# Patient Record
Sex: Female | Born: 1955 | Race: White | Hispanic: No | Marital: Single | Smoking: Current every day smoker
Health system: Southern US, Community
[De-identification: ages and names within clinical notes are randomized; demographics above are authoritative.]

## PROBLEM LIST (undated history)

## (undated) HISTORY — PX: WRIST FRACTURE SURGERY: SHX121

## (undated) HISTORY — PX: HERNIA REPAIR: SHX51

## (undated) HISTORY — PX: TUBAL LIGATION: SHX77

---

## 2014-06-21 ENCOUNTER — Ambulatory Visit: Payer: Self-pay | Admitting: Emergency Medicine

## 2014-11-27 ENCOUNTER — Ambulatory Visit
Admission: EM | Admit: 2014-11-27 | Discharge: 2014-11-27 | Disposition: A | Discharge status: Home / Self Care | Payer: Self-pay | Attending: Family Medicine | Admitting: Family Medicine

## 2014-11-27 ENCOUNTER — Ambulatory Visit: Admit: 2014-11-27 | Payer: Self-pay

## 2014-11-27 ENCOUNTER — Ambulatory Visit: Payer: Self-pay

## 2014-11-27 ENCOUNTER — Encounter: Payer: Self-pay | Admitting: Emergency Medicine

## 2014-11-27 DIAGNOSIS — M47812 Spondylosis without myelopathy or radiculopathy, cervical region: Secondary | ICD-10-CM

## 2014-11-27 DIAGNOSIS — S0101XA Laceration without foreign body of scalp, initial encounter: Secondary | ICD-10-CM

## 2014-11-27 DIAGNOSIS — M4692 Unspecified inflammatory spondylopathy, cervical region: Secondary | ICD-10-CM

## 2014-11-27 DIAGNOSIS — S060X0A Concussion without loss of consciousness, initial encounter: Secondary | ICD-10-CM

## 2014-11-27 DIAGNOSIS — S0093XA Contusion of unspecified part of head, initial encounter: Secondary | ICD-10-CM

## 2014-11-27 DIAGNOSIS — S199XXA Unspecified injury of neck, initial encounter: Secondary | ICD-10-CM

## 2014-11-27 MED ORDER — KETOROLAC TROMETHAMINE 60 MG/2ML IM SOLN
60.0000 mg | Freq: Once | INTRAMUSCULAR | Status: AC
  Administered 2014-11-27: 60 mg via INTRAMUSCULAR

## 2014-11-27 MED ORDER — MELOXICAM 15 MG PO TABS: 15.0000 mg | ORAL_TABLET | Freq: Every day | ORAL | Status: AC

## 2014-11-27 MED ORDER — ORPHENADRINE CITRATE ER 100 MG PO TB12: 100.0000 mg | ORAL_TABLET | Freq: Two times a day (BID) | ORAL | Status: AC

## 2014-11-27 MED ORDER — TETANUS-DIPHTH-ACELL PERTUSSIS 5-2.5-18.5 LF-MCG/0.5 IM SUSP
0.5000 mL | Freq: Once | INTRAMUSCULAR | Status: AC
  Administered 2014-11-27: 0.5 mL via INTRAMUSCULAR

## 2014-11-27 NOTE — ED Notes (Addendum)
Pt reports walking into work this morning about 8;30, sun was in her eyes and she hit her forehead on generator. Denies LOC, n/v. Bleeding controlled.  Pt reports severe headache and neck pain.  Closed laceration and bruise to mid forehead by hairline.   Pt also reports mild dizziness and light bothers here eyes.

## 2014-11-27 NOTE — Discharge Instructions (Signed)
°Concussion °A concussion is a brain injury. It is caused by: °· A hit to the head. °· A quick and sudden movement (jolt) of the head or neck. °A concussion is usually not life threatening. Even so, it can cause serious problems. If you had a concussion before, you may have concussion-like problems after a hit to your head. °HOME CARE °General Instructions °· Follow your doctor's directions carefully. °· Take medicines only as told by your doctor. °· Only take medicines your doctor says are safe. °· Do not drink alcohol until your doctor says it is okay. Alcohol and some drugs can slow down healing. They can also put you at risk for further injury. °· If you are having trouble remembering things, write them down. °· Try to do one thing at a time if you get distracted easily. For example, do not watch TV while making dinner. °· Talk to your family members or close friends when making important decisions. °· Follow up with your doctor as told. °· Watch your symptoms. Tell others to do the same. Serious problems can sometimes happen after a concussion. Older adults are more likely to have these problems. °· Tell your teachers, school nurse, school counselor, coach, athletic trainer, or work manager about your concussion. Tell them about what you can or cannot do. They should watch to see if: °¨ It gets even harder for you to pay attention or concentrate. °¨ It gets even harder for you to remember things or learn new things. °¨ You need more time than normal to finish things. °¨ You become annoyed (irritable) more than before. °¨ You are not able to deal with stress as well. °¨ You have more problems than before. °· Rest. Make sure you: °¨ Get plenty of sleep at night. °¨ Go to sleep early. °¨ Go to bed at the same time every day. Try to wake up at the same time. °¨ Rest during the day. °¨ Take naps when you feel tired. °· Limit activities where you have to think a lot or concentrate. These include: °¨ Doing  homework. °¨ Doing work related to a job. °¨ Watching TV. °¨ Using the computer. °Returning To Your Regular Activities °Return to your normal activities slowly, not all at once. You must give your body and brain enough time to heal.  °· Do not play sports or do other athletic activities until your doctor says it is okay. °· Ask your doctor when you can drive, ride a bicycle, or work other vehicles or machines. Never do these things if you feel dizzy. °· Ask your doctor about when you can return to work or school. °Preventing Another Concussion °It is very important to avoid another brain injury, especially before you have healed. In rare cases, another injury can lead to permanent brain damage, brain swelling, or death. The risk of this is greatest during the first 7-10 days after your injury. Avoid injuries by:  °· Wearing a seat belt when riding in a car. °· Not drinking too much alcohol. °· Avoiding activities that could lead to a second concussion (such as contact sports). °· Wearing a helmet when doing activities like: °¨ Biking. °¨ Skiing. °¨ Skateboarding. °¨ Skating. °· Making your home safer by: °¨ Removing things from the floor or stairways that could make you trip. °¨ Using grab bars in bathrooms and handrails by stairs. °¨ Placing non-slip mats on floors and in bathtubs. °¨ Improve lighting in dark areas. °GET HELP IF: °· It   gets even harder for you to pay attention or concentrate.  It gets even harder for you to remember things or learn new things.  You need more time than normal to finish things.  You become annoyed (irritable) more than before.  You are not able to deal with stress as well.  You have more problems than before.  You have problems keeping your balance.  You are not able to react quickly when you should. Get help if you have any of these problems for more than 2 weeks:   Lasting (chronic) headaches.  Dizziness or trouble balancing.  Feeling sick to your stomach  (nausea).  Seeing (vision) problems.  Being affected by noises or light more than normal.  Feeling sad, low, down in the dumps, blue, gloomy, or empty (depressed).  Mood changes (mood swings).  Feeling of fear or nervousness about what may happen (anxiety).  Feeling annoyed.  Memory problems.  Problems concentrating or paying attention.  Sleep problems.  Feeling tired all the time. GET HELP RIGHT AWAY IF:   You have bad headaches or your headaches get worse.  You have weakness (even if it is in one hand, leg, or part of the face).  You have loss of feeling (numbness).  You feel off balance.  You keep throwing up (vomiting).  You feel tired.  One black center of your eye (pupil) is larger than the other.  You twitch or shake violently (convulse).  Your speech is not clear (slurred).  You are more confused, easily angered (agitated), or annoyed than before.  You have more trouble resting than before.  You are unable to recognize people or places.  You have neck pain.  It is difficult to wake you up.  You have unusual behavior changes.  You pass out (lose consciousness). MAKE SURE YOU:   Understand these instructions.  Will watch your condition.  Will get help right away if you are not doing well or get worse. Document Released: 03/05/2009 Document Revised: 08/01/2013 Document Reviewed: 10/07/2012 [facility] Patient Information 2015 [place], [place]. This information is not intended to replace advice given to you by your health care provider. Make sure you discuss any questions you have with your health care provider.  Contusion A contusion is a deep bruise. Contusions happen when an injury causes bleeding under the skin. Signs of bruising include pain, puffiness (swelling), and discolored skin. The contusion may turn blue, purple, or yellow. HOME CARE   Put ice on the injured area.  Put ice in a plastic bag.  Place a towel between your skin and the  bag.  Leave the ice on for 15-20 minutes, 03-04 times a day.  Only take medicine as told by your doctor.  Rest the injured area.  If possible, raise (elevate) the injured area to lessen puffiness. GET HELP RIGHT AWAY IF:   You have more bruising or puffiness.  You have pain that is getting worse.  Your puffiness or pain is not helped by medicine. MAKE SURE YOU:   Understand these instructions.  Will watch your condition.  Will get help right away if you are not doing well or get worse. Document Released: 09/03/2007 Document Revised: 06/09/2011 Document Reviewed: 01/20/2011 [facility] Patient Information 2015 [place], [place]. This information is not intended to replace advice given to you by your health care provider. Make sure you discuss any questions you have with your health care provider.  Head Injury You have a head injury. Headaches and throwing up (vomiting) are  common after a head injury. It should be easy to wake up from sleeping. Sometimes you must stay in the hospital. Most problems happen within the first 24 hours. Side effects may occur up to 7-10 days after the injury.  WHAT ARE THE TYPES OF HEAD INJURIES? Head injuries can be as minor as a bump. Some head injuries can be more severe. More severe head injuries include:  A jarring injury to the brain (concussion).  A bruise of the brain (contusion). This mean there is bleeding in the brain that can cause swelling.  A cracked skull (skull fracture).  Bleeding in the brain that collects, clots, and forms a bump (hematoma). WHEN SHOULD I GET HELP RIGHT AWAY?   You are confused or sleepy.  You cannot be woken up.  You feel sick to your stomach (nauseous) or keep throwing up (vomiting).  Your dizziness or unsteadiness is getting worse.  You have very bad, lasting headaches that are not helped by medicine. Take medicines only as told by your doctor.  You cannot use your arms or legs like normal.  You cannot  walk.  You notice changes in the black spots in the center of the colored part of your eye (pupil).  You have clear or bloody fluid coming from your nose or ears.  You have trouble seeing. During the next 24 hours after the injury, you must stay with someone who can watch you. This person should get help right away (call 911 in the [place] if you start to shake and are not able to control it (have seizures), you pass out, or you are unable to wake up. HOW CAN I PREVENT A HEAD INJURY IN THE FUTURE?  Wear seat belts.  Wear a helmet while bike riding and playing sports like football.  Stay away from dangerous activities around the house. WHEN CAN I RETURN TO NORMAL ACTIVITIES AND ATHLETICS? See your doctor before doing these activities. You should not do normal activities or play contact sports until 1 week after the following symptoms have stopped:  Headache that does not go away.  Dizziness.  Poor attention.  Confusion.  Memory problems.  Sickness to your stomach or throwing up.  Tiredness.  Fussiness.  Bothered by bright lights or loud noises.  Anxiousness or depression.  Restless sleep. MAKE SURE YOU:   Understand these instructions.  Will watch your condition.  Will get help right away if you are not doing well or get worse. Document Released: 02/28/2008 Document Revised: 08/01/2013 Document Reviewed: 11/22/2012 [facility] Patient Information 2015 [place], [place]. This information is not intended to replace advice given to you by your health care provider. Make sure you discuss any questions you have with your health care provider.

## 2014-11-27 NOTE — ED Provider Notes (Signed)
CSN: [id number]     Arrival date & time 11/27/14  4098 History   First MD Initiated Contact with Patient 11/27/14 1106     Chief Complaint  Patient presents with  . Head Injury   (Consider location/radiation/quality/duration/timing/severity/associated sxs/prior Treatment) Patient is a 59 y.o. female presenting with head injury. The history is provided by the patient. No language interpreter was used.  Head Injury Location:  Frontal Time since incident:  3 hours Mechanism of injury: direct blow   Pain details:    Quality:  Sharp   Radiates to: The pain radiates to her neck.   Severity:  Moderate   Timing:  Constant   Progression:  Unchanged Chronicity:  New Relieved by:  Nothing Associated symptoms: blurred vision, headache and neck pain   Associated symptoms: no nausea, no seizures and no vomiting   Risk factors: no alcohol use, no aspirin use and no obesity    Patient reports walking into generator while going to work. She reports some was in her eyes blinded her and she did not see the generator after she walked into it.  Because she was not clocked in at the time she states that her job is disallowing workers comp port. She received a laceration on her forehead which bled profusely pushes subsequently stopped. She denies LOC but does report being stunned and taken back. She has a headache now and  photophobia at this time as well. She states she did not fall but almost almost fell after hitting her head. From the description her head was forced back and now she also has neck pain as well. History reviewed. No pertinent past medical history. Past Surgical History  Procedure Laterality Date  . Hernia repair    . Tubal ligation    . Wrist fracture surgery Right     severed tendon repair    History reviewed. No pertinent family history. Social History  Substance Use Topics  . Smoking status: Current Every Day Smoker -- 0.50 packs/day    Types: Cigarettes  . Smokeless tobacco:  None  . Alcohol Use: No   OB History    No data available     Review of Systems  Constitutional: Positive for activity change.  Eyes: Positive for blurred vision.  Gastrointestinal: Negative for nausea and vomiting.  Musculoskeletal: Positive for neck pain.  Neurological: Positive for headaches. Negative for seizures.  All other systems reviewed and are negative.   Allergies  Review of patient's allergies indicates no known allergies.  Home Medications   Prior to Admission medications   Medication Sig Start Date End Date Taking? Authorizing Provider  meloxicam (MOBIC) 15 MG tablet Take 1 tablet (15 mg total) by mouth daily. 11/27/14   Hassan Rowan, MD  orphenadrine (NORFLEX) 100 MG tablet Take 1 tablet (100 mg total) by mouth 2 (two) times daily. 11/27/14   Hassan Rowan, MD   Patient denies any significant medical history nurse's notes reviewed. Patient still smokes and was warned that she needs to stop smoking.  Meds Ordered and Administered this Visit   Medications  ketorolac (TORADOL) injection 60 mg (60 mg Intramuscular Given 11/27/14 1237)  Tdap (BOOSTRIX) injection 0.5 mL (0.5 mLs Intramuscular Given 11/27/14 1151)    BP 136/97 mmHg  Pulse 78  Temp(Src) 98.1 F (36.7 C) (Oral)  Resp 16  Ht  (1.626 m)  Wt 116 lb (52.617 kg)  BMI 19.90 kg/m2  SpO2 98% No data found.   Physical Exam  Constitutional: She is  oriented to person, place, and time. She appears well-developed.  HENT:  Head: Normocephalic. Head is with laceration.    Right Ear: Hearing and tympanic membrane normal.  Left Ear: Hearing and tympanic membrane normal.  Small laceration in the middle of the forehead will require suturing this time discloses no bleeding present.  Eyes: Pupils are equal, round, and reactive to light. Right eye exhibits no discharge.  Neck: Normal range of motion. Neck supple. Muscular tenderness present.    Patient has tenderness along the C-spine noted the occipital  area and some discomfort down to C5.  Musculoskeletal: Normal range of motion. She exhibits no edema or tenderness.  Neurological: She is alert and oriented to person, place, and time. She has normal strength and normal reflexes. No cranial nerve deficit or sensory deficit.  Skin: Skin is warm and dry. No erythema.  Psychiatric: She has a normal mood and affect.  Nursing note and vitals reviewed.   ED Course  Procedures (including critical care time)  Labs Review Labs Reviewed - No data to display  Imaging Review No results found.   Visual Acuity Review  Right Eye Distance:   Left Eye Distance:   Bilateral Distance:    Right Eye Near:   Left Eye Near:    Bilateral Near:         MDM   1. Head contusion, initial encounter   2. Concussion, without loss of consciousness, initial encounter   3. Scalp laceration, initial encounter   4. Neck injuries, initial encounter   5. Cervical spine arthritis     Patient initially was to have a CT scan of the head. Question initially whether or not a CT was really wanted. Because the headache and amount of pain she was having we will went ahead and ordered it. However once she found out she would be responsible for the bill she has declined getting the CT scan. She was given an injection of Toradol and a tetanus update with a TdAP injection. Patient will be given a work note she feels that she would not agree to go back to work on Wednesday by stress to her that after Wednesday if she feels like she'll need more time she'll need to return for reevaluation. Patient was not close since it does appear to be bleeding significantly.   Hassan Rowan, MD 11/30/14 [phone]

## 2014-11-27 NOTE — ED Notes (Signed)
Pt refused CT scan as she is self pay and doesn't want to pay for it. Pt agreed to the pain med shot and requested work note off for few days. MD notified.

## 2015-05-09 ENCOUNTER — Ambulatory Visit (INDEPENDENT_AMBULATORY_CARE_PROVIDER_SITE_OTHER): Payer: Self-pay

## 2015-05-09 ENCOUNTER — Ambulatory Visit
Admission: EM | Admit: 2015-05-09 | Discharge: 2015-05-09 | Disposition: A | Discharge status: Home / Self Care | Payer: Self-pay | Attending: Family Medicine | Admitting: Family Medicine

## 2015-05-09 DIAGNOSIS — R0781 Pleurodynia: Secondary | ICD-10-CM

## 2015-05-09 DIAGNOSIS — R319 Hematuria, unspecified: Secondary | ICD-10-CM

## 2015-05-09 DIAGNOSIS — K5900 Constipation, unspecified: Secondary | ICD-10-CM

## 2015-05-09 DIAGNOSIS — M62830 Muscle spasm of back: Secondary | ICD-10-CM

## 2015-05-09 DIAGNOSIS — N390 Urinary tract infection, site not specified: Secondary | ICD-10-CM

## 2015-05-09 LAB — COMPREHENSIVE METABOLIC PANEL
ALT: 13 U/L — AB (ref 14–54)
AST: 13 U/L — AB (ref 15–41)
Albumin: 4.1 g/dL (ref 3.5–5.0)
Alkaline Phosphatase: 67 U/L (ref 38–126)
Anion gap: 6 (ref 5–15)
BUN: 14 mg/dL (ref 6–20)
CHLORIDE: 105 mmol/L (ref 101–111)
CO2: 28 mmol/L (ref 22–32)
CREATININE: 0.91 mg/dL (ref 0.44–1.00)
Calcium: 9.1 mg/dL (ref 8.9–10.3)
Glucose, Bld: 95 mg/dL (ref 65–99)
POTASSIUM: 3.8 mmol/L (ref 3.5–5.1)
SODIUM: 139 mmol/L (ref 135–145)
Total Bilirubin: 0.2 mg/dL — ABNORMAL LOW (ref 0.3–1.2)
Total Protein: 7.1 g/dL (ref 6.5–8.1)

## 2015-05-09 LAB — CBC WITH DIFFERENTIAL/PLATELET
BASOS ABS: 0.1 10*3/uL (ref 0–0.1)
BASOS PCT: 1 %
EOS ABS: 0.1 10*3/uL (ref 0–0.7)
Eosinophils Relative: 1 %
HEMATOCRIT: 44.4 % (ref 35.0–47.0)
HEMOGLOBIN: 15.1 g/dL (ref 12.0–16.0)
Lymphocytes Relative: 30 %
Lymphs Abs: 3.1 10*3/uL (ref 1.0–3.6)
MCH: 30 pg (ref 26.0–34.0)
MCHC: 34.1 g/dL (ref 32.0–36.0)
MCV: 88 fL (ref 80.0–100.0)
Monocytes Absolute: 0.8 10*3/uL (ref 0.2–0.9)
Monocytes Relative: 7 %
NEUTROS ABS: 6.4 10*3/uL (ref 1.4–6.5)
NEUTROS PCT: 61 %
Platelets: 219 10*3/uL (ref 150–440)
RBC: 5.05 MIL/uL (ref 3.80–5.20)
RDW: 13.9 % (ref 11.5–14.5)
WBC: 10.6 10*3/uL (ref 3.6–11.0)

## 2015-05-09 LAB — URINALYSIS COMPLETE WITH MICROSCOPIC (ARMC ONLY)
Bilirubin Urine: NEGATIVE
Glucose, UA: NEGATIVE mg/dL
Ketones, ur: NEGATIVE mg/dL
LEUKOCYTES UA: NEGATIVE
Nitrite: NEGATIVE
PH: 5.5 (ref 5.0–8.0)
PROTEIN: NEGATIVE mg/dL
Specific Gravity, Urine: 1.025 (ref 1.005–1.030)

## 2015-05-09 LAB — AMYLASE: AMYLASE: 51 U/L (ref 28–100)

## 2015-05-09 LAB — LIPASE, BLOOD: LIPASE: 21 U/L (ref 11–51)

## 2015-05-09 MED ORDER — CIPROFLOXACIN HCL 500 MG PO TABS: 500.0000 mg | ORAL_TABLET | Freq: Two times a day (BID) | ORAL | Status: AC

## 2015-05-09 MED ORDER — MELOXICAM 15 MG PO TABS: 15.0000 mg | ORAL_TABLET | Freq: Every day | ORAL | Status: AC

## 2015-05-09 MED ORDER — ORPHENADRINE CITRATE ER 100 MG PO TB12: 100.0000 mg | ORAL_TABLET | Freq: Two times a day (BID) | ORAL | Status: AC

## 2015-05-09 MED ORDER — KETOROLAC TROMETHAMINE 60 MG/2ML IM SOLN
60.0000 mg | Freq: Once | INTRAMUSCULAR | Status: AC
  Administered 2015-05-09: 60 mg via INTRAMUSCULAR
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
## 2015-05-09 NOTE — Discharge Instructions (Signed)
Chest Wall Pain Chest wall pain is pain in or around the bones and muscles of your chest. Sometimes, an injury causes this pain. Sometimes, the cause may not be known. This pain may take several weeks or longer to get better. HOME CARE Pay attention to any changes in your symptoms. Take these actions to help with your pain:  Rest as told by your doctor.  Avoid activities that cause pain. Try not to use your chest, belly (abdominal), or side muscles to lift heavy things.  If directed, apply ice to the painful area:  Put ice in a plastic bag.  Place a towel between your skin and the bag.  Leave the ice on for 20 minutes, 2-3 times per day.  Take over-the-counter and prescription medicines only as told by your doctor.  Do not use tobacco products, including cigarettes, chewing tobacco, and e-cigarettes. If you need help quitting, ask your doctor.  Keep all follow-up visits as told by your doctor. This is important. GET HELP IF:  You have a fever.  Your chest pain gets worse.  You have new symptoms. GET HELP RIGHT AWAY IF:  You feel sick to your stomach (nauseous) or you throw up (vomit).  You feel sweaty or light-headed.  You have a cough with phlegm (sputum) or you cough up blood.  You are short of breath.   This information is not intended to replace advice given to you by your health care provider. Make sure you discuss any questions you have with your health care provider.   Document Released: 09/03/2007 Document Revised: 12/06/2014 Document Reviewed: 06/12/2014 Elsevier Interactive Patient Education 2016 [employer].  Constipation, Adult Constipation is when a person:  Poops (has a bowel movement) less than 3 times a week.  Has a hard time pooping.  Has poop that is dry, hard, or bigger than normal. HOME CARE   Eat foods with a lot of fiber in them. This includes fruits, vegetables, beans, and whole grains such as brown rice.  Avoid fatty foods and foods  with a lot of sugar. This includes french fries, hamburgers, cookies, candy, and soda.  If you are not getting enough fiber from food, take products with added fiber in them (supplements).  Drink enough fluid to keep your pee (urine) clear or pale yellow.  Exercise on a regular basis, or as told by your doctor.  Go to the restroom when you feel like you need to poop. Do not hold it.  Only take medicine as told by your doctor. Do not take medicines that help you poop (laxatives) without talking to your doctor first. GET HELP RIGHT AWAY IF:   You have bright red blood in your poop (stool).  Your constipation lasts more than 4 days or gets worse.  You have belly (abdominal) or butt (rectal) pain.  You have thin poop (as thin as a pencil).  You lose weight, and it cannot be explained. MAKE SURE YOU:   Understand these instructions.  Will watch your condition.  Will get help right away if you are not doing well or get worse.   This information is not intended to replace advice given to you by your health care provider. Make sure you discuss any questions you have with your health care provider.   Document Released: 09/03/2007 Document Revised: 04/07/2014 Document Reviewed: 12/27/2012 Elsevier Interactive Patient Education 2016 [employer]  Costochondritis Costochondritis is a condition in which the tissue (cartilage) that connects your ribs with your breastbone (sternum)  becomes irritated. It causes pain in the chest and rib area. It usually goes away on its own over time. HOME CARE  Avoid activities that wear you out.  Do not strain your ribs. Avoid activities that use your:  Chest.  Belly.  Side muscles.  Put ice on the area for the first 2 days after the pain starts.  Put ice in a plastic bag.  Place a towel between your skin and the bag.  Leave the ice on for 20 minutes, 2-3 times a day.  Only take medicine as told by your doctor. GET HELP IF:  You have  redness or puffiness (swelling) in the rib area.  Your pain does not go away with rest or medicine. GET HELP RIGHT AWAY IF:   Your pain gets worse.  You are very uncomfortable.  You have trouble breathing.  You cough up blood.  You start sweating or throwing up (vomiting).  You have a fever or lasting symptoms for more than 2-3 days.  You have a fever and your symptoms suddenly get worse. MAKE SURE YOU:   Understand these instructions.  Will watch your condition.  Will get help right away if you are not doing well or get worse.   This information is not intended to replace advice given to you by your health care provider. Make sure you discuss any questions you have with your health care provider.   Document Released: 09/03/2007 Document Revised: 11/17/2012 Document Reviewed: 10/19/2012 Elsevier Interactive Patient Education 2016 [employer] Inc[employer]  Hematuria, Adult Hematuria is blood in your urine. It can be caused by a bladder infection, kidney infection, prostate infection, kidney stone, or cancer of your urinary tract. Infections can usually be treated with medicine, and a kidney stone usually will pass through your urine. If neither of these is the cause of your hematuria, further workup to find out the reason may be needed. It is very important that you tell your health care provider about any blood you see in your urine, even if the blood stops without treatment or happens without causing pain. Blood in your urine that happens and then stops and then happens again can be a symptom of a very serious condition. Also, pain is not a symptom in the initial stages of many urinary cancers. HOME CARE INSTRUCTIONS   Drink lots of fluid, 3-4 quarts a day. If you have been diagnosed with an infection, cranberry juice is especially recommended, in addition to large amounts of water.  Avoid caffeine, tea, and carbonated beverages because they tend to irritate the bladder.  Avoid alcohol  because it may irritate the prostate.  Take all medicines as directed by your health care provider.  If you were prescribed an antibiotic medicine, finish it all even if you start to feel better.  If you have been diagnosed with a kidney stone, follow your health care provider's instructions regarding straining your urine to catch the stone.  Empty your bladder often. Avoid holding urine for long periods of time.  After a bowel movement, women should cleanse front to back. Use each tissue only once.  Empty your bladder before and after sexual intercourse if you are a female. SEEK MEDICAL CARE IF:  You develop back pain.  You have a fever.  You have a feeling of sickness in your stomach (nausea) or vomiting.  Your symptoms are not better in 3 days. Return sooner if you are getting worse. SEEK IMMEDIATE MEDICAL CARE IF:   You develop severe  vomiting and are unable to keep the medicine down.  You develop severe back or abdominal pain despite taking your medicines.  You begin passing a large amount of blood or clots in your urine.  You feel extremely weak or faint, or you pass out. MAKE SURE YOU:   Understand these instructions.  Will watch your condition.  Will get help right away if you are not doing well or get worse.   This information is not intended to replace advice given to you by your health care provider. Make sure you discuss any questions you have with your health care provider.   Document Released: 03/17/2005 Document Revised: 04/07/2014 Document Reviewed: 11/15/2012 Elsevier Interactive Patient Education 2016 Else[employer] Inc.   Muscle Cramps and Spasms Muscle cramps and spasms are when muscles tighten by themselves. They usually get better within minutes. Muscle cramps are painful. They are usually stronger and last longer than muscle spasms. Muscle spasms may or may not be painful. They can last a few seconds or much longer. HOME CARE  Drink enough fluid to  keep your pee (urine) clear or pale yellow.  Massage, stretch, and relax the muscle.  Use a warm towel, heating pad, or warm shower water on tight muscles.  Place ice on the muscle if it is tender or in pain.  Put ice in a plastic bag.  Place a towel between your skin and the bag.  Leave the ice on for 15-20 minutes, 03-04 times a day.  Only take medicine as told by your doctor. GET HELP RIGHT AWAY IF:  Your cramps or spasms get worse, happen more often, or do not get better with time. MAKE SURE YOU:  Understand these instructions.  Will watch your condition.  Will get help right away if you are not doing well or get worse.   This information is not intended to replace advice given to you by your health care provider. Make sure you discuss any questions you have with your health care provider.   Document Released: 02/28/2008 Document Revised: 07/12/2012 Document Reviewed: 03/03/2012 Elsevier Interactive Patient Education [employer]

## 2015-05-11 LAB — URINE CULTURE: SPECIAL REQUESTS: NORMAL

## 2017-03-22 IMAGING — CR DG LUMBAR SPINE COMPLETE 4+V
5 series · 5 of 5 positions shown · non-contrast
Comparison: None.

CLINICAL DATA: 59-year-old female with back pain

EXAM:
LUMBAR SPINE - COMPLETE 4+ VIEW

[l-spine ap]
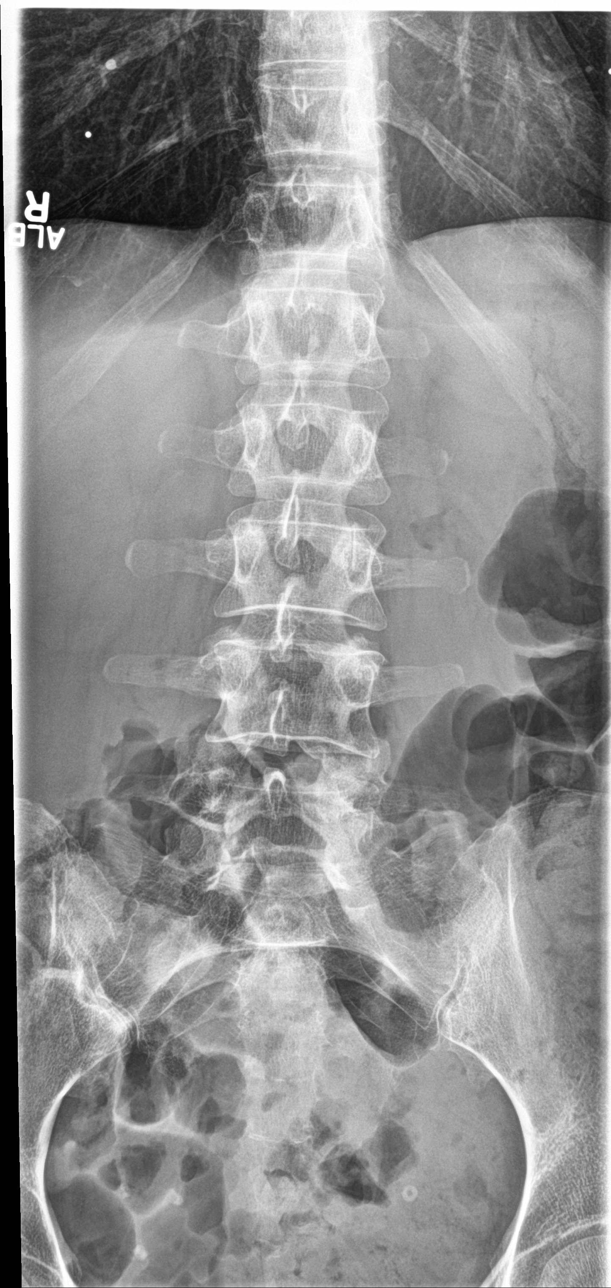

[l-spine obl (1 of 2)]
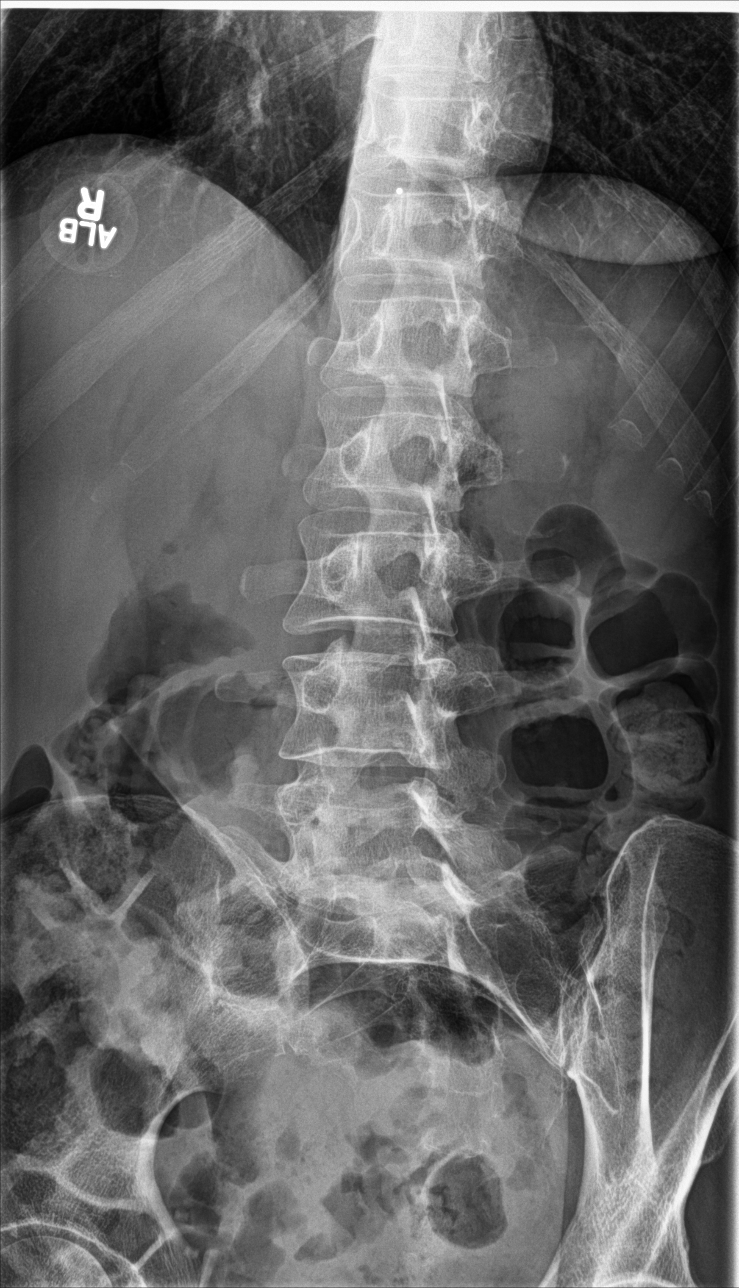

[l-spine obl (2 of 2)]
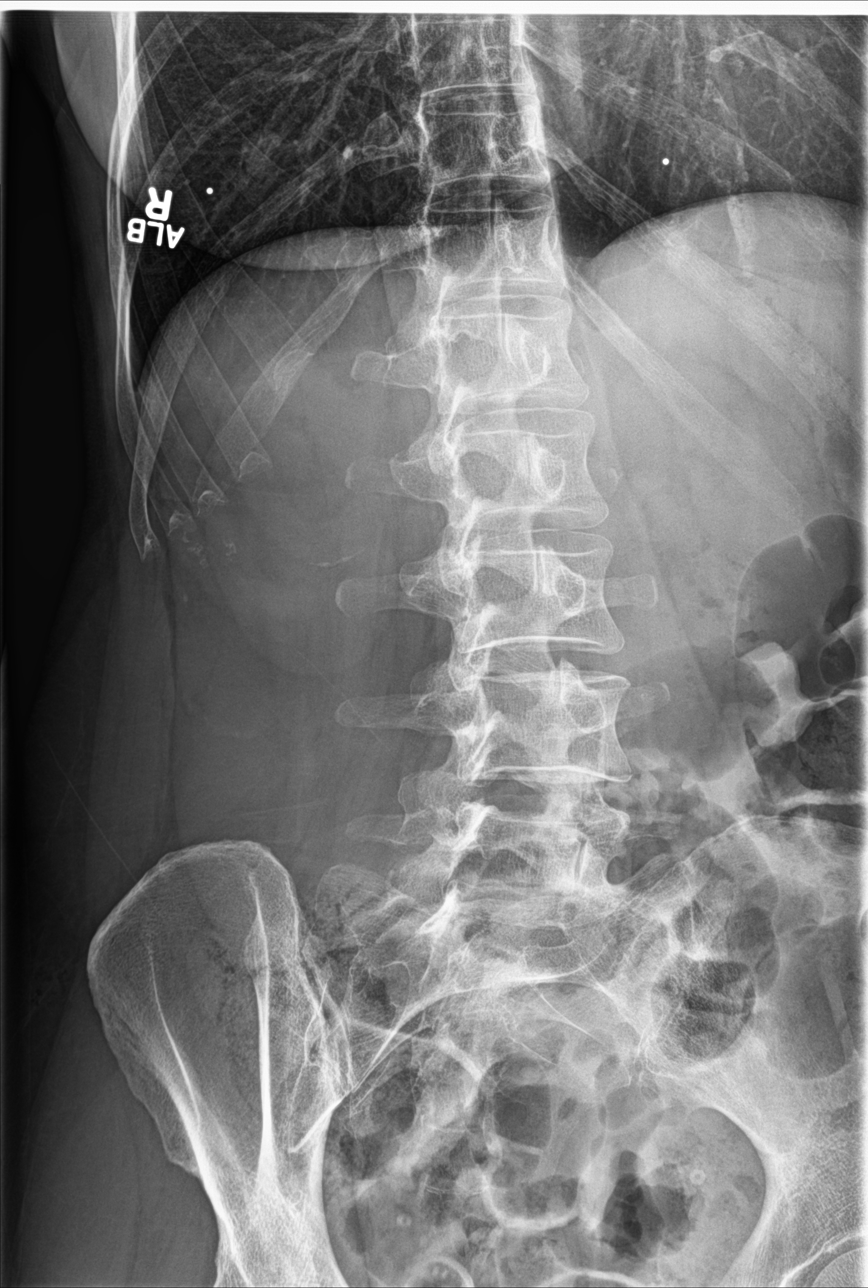

[l-spine lat]
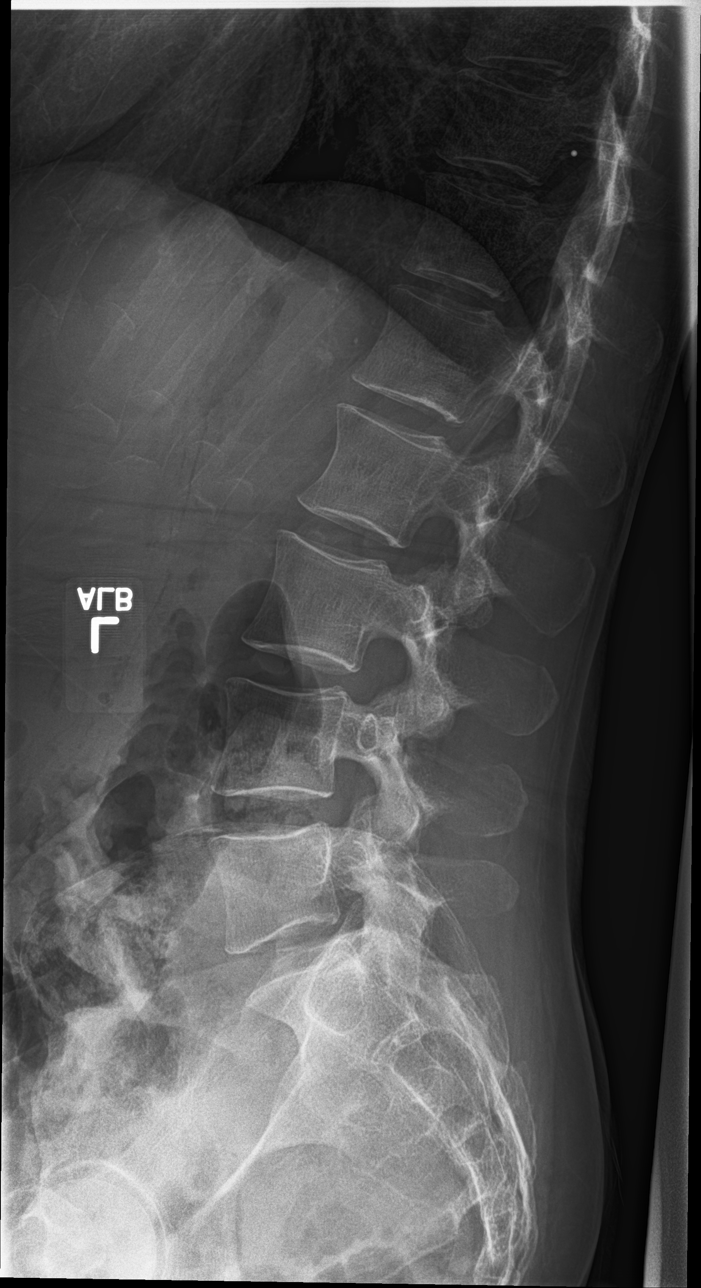

[l-spine spot]
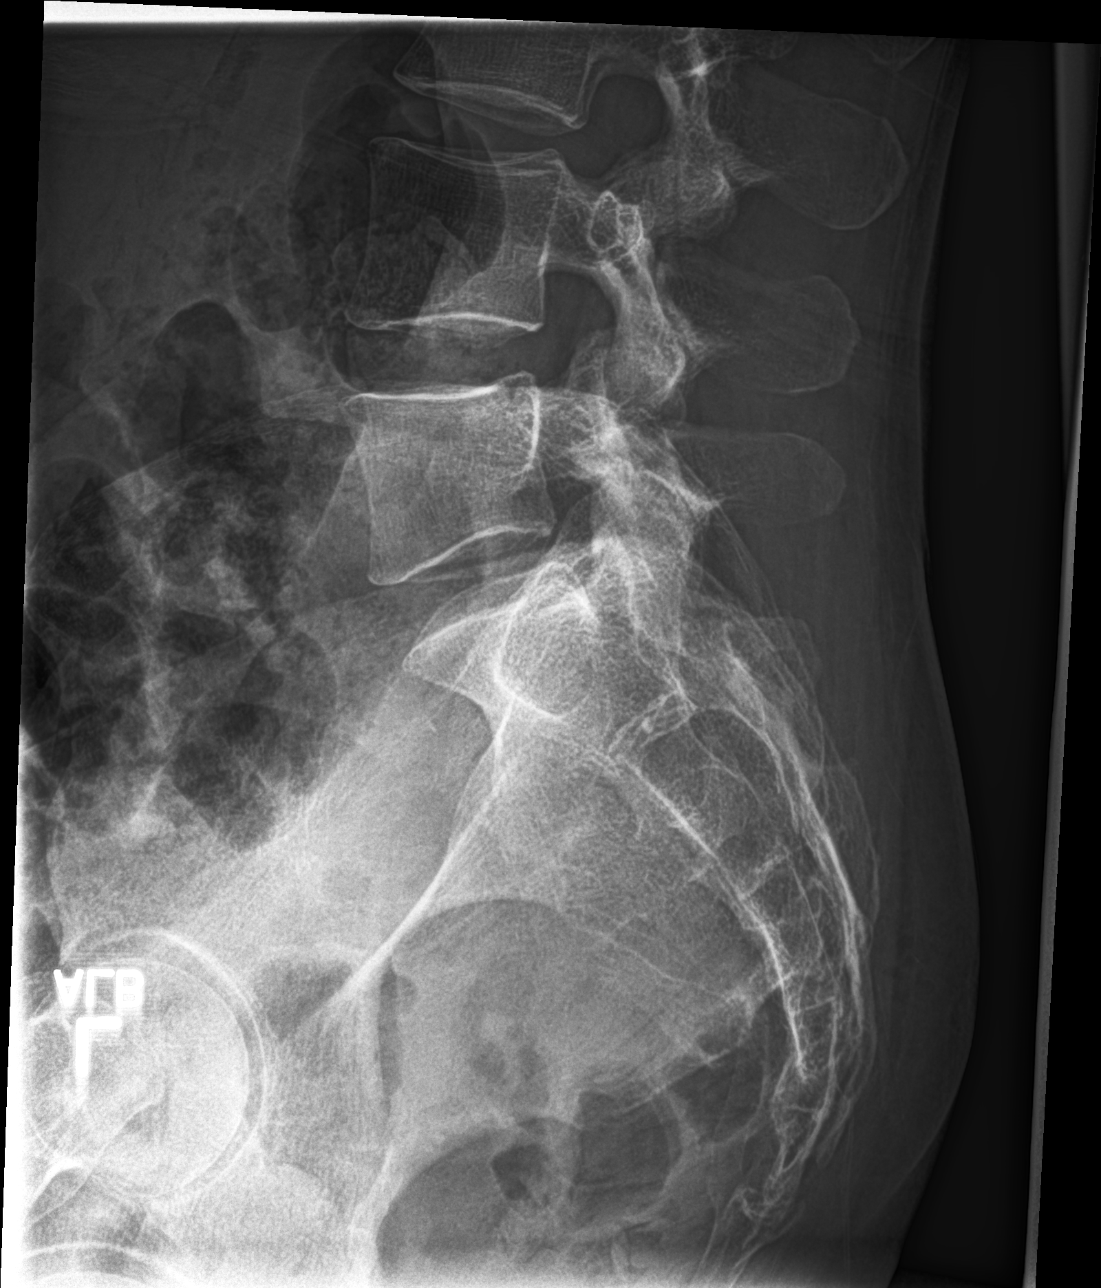

[5 of 5 positions shown; findings below may reference images not displayed]

FINDINGS: No acute fracture or subluxation of the lumbar spine. The vertebral
body heights and disc spaces are maintained. The visualized
transverse and spinous processes are intact. The soft tissues are
grossly unremarkable.
IMPRESSION: Negative.
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
